# Patient Record
Sex: Female | Born: 1965 | Race: White | Hispanic: No | Marital: Married | Smoking: Never smoker
Health system: Southern US, Community
[De-identification: ages and names within clinical notes are randomized; demographics above are authoritative.]

---

## 2008-09-23 ENCOUNTER — Ambulatory Visit: Payer: Self-pay | Admitting: Family Medicine

## 2008-09-27 ENCOUNTER — Telehealth: Payer: Self-pay | Admitting: Occupational Medicine

## 2008-09-30 ENCOUNTER — Encounter: Admission: RE | Admit: 2008-09-30 | Discharge: 2008-09-30 | Payer: Self-pay | Admitting: Sports Medicine

## 2008-10-02 ENCOUNTER — Encounter: Admission: RE | Admit: 2008-10-02 | Discharge: 2008-10-02 | Payer: Self-pay | Admitting: Sports Medicine

## 2011-10-20 ENCOUNTER — Emergency Department (INDEPENDENT_AMBULATORY_CARE_PROVIDER_SITE_OTHER)
Admission: EM | Admit: 2011-10-20 | Discharge: 2011-10-20 | Disposition: A | Discharge status: Home / Self Care | Payer: No Typology Code available for payment source | Source: Home / Self Care | Attending: Family Medicine | Admitting: Family Medicine

## 2011-10-20 DIAGNOSIS — J069 Acute upper respiratory infection, unspecified: Secondary | ICD-10-CM

## 2011-10-20 DIAGNOSIS — J029 Acute pharyngitis, unspecified: Secondary | ICD-10-CM

## 2011-10-20 LAB — POCT INFLUENZA A/B
Influenza A, POC: NEGATIVE
Influenza B, POC: NEGATIVE

## 2011-10-20 MED ORDER — AMOXICILLIN 875 MG PO TABS: 875.0000 mg | ORAL_TABLET | Freq: Two times a day (BID) | ORAL | Status: AC

## 2011-10-20 MED ORDER — BENZONATATE 200 MG PO CAPS: 200.0000 mg | ORAL_CAPSULE | Freq: Every day | ORAL | Status: AC

## 2011-10-20 NOTE — ED Notes (Signed)
States symptoms started 2 days ago with nasal congestion and sore throat. States she has a hx of strep

## 2011-10-20 NOTE — ED Provider Notes (Signed)
History     CSN: [id number]  Arrival date & time 10/20/11  0911   First MD Initiated Contact with Patient 10/20/11 [phone]      Chief Complaint  Patient presents with  . Sore Throat      HPI Comments: Patient complains of approximately 2 day history of gradually progressive URI symptoms beginning with a mild sore throat and headache, followed by progressive nasal congestion.  A cough started today.  No fatigue or myalgias.  Cough is now generally non-productive.  There has been no pleuritic pain, shortness of breath, or wheezes.   Patient is a 45 y.o. female presenting with pharyngitis. The history is provided by the patient.  Sore Throat This is a new problem. The current episode started 2 days ago. The problem occurs constantly. The problem has been gradually worsening. Associated symptoms include headaches. Pertinent negatives include no chest pain, no abdominal pain and no shortness of breath. The symptoms are aggravated by swallowing. The symptoms are relieved by nothing. She has tried acetaminophen for the symptoms. The treatment provided no relief.    History reviewed. No pertinent past medical history.  History reviewed. No pertinent past surgical history.  Family History  Problem Relation Age of Onset  . Diabetes Father     History  Substance Use Topics  . Smoking status: Never Smoker   . Smokeless tobacco: Not on file  . Alcohol Use: Yes    OB History    Grav Para Term Preterm Abortions TAB SAB Ect Mult Living                  Review of Systems  Respiratory: Negative for shortness of breath.   Cardiovascular: Negative for chest pain.  Gastrointestinal: Negative for abdominal pain.  Neurological: Positive for headaches.   + sore throat + cough No pleuritic pain No wheezing + nasal congestion ? post-nasal drainage No sinus pain/pressure No itchy/red eyes ? earache No hemoptysis No SOB + fever/chills No nausea No vomiting No abdominal pain No  diarrhea No urinary symptoms No skin rashes No fatigue No myalgias + headache Used OTC meds without relief  Allergies  Review of patient's allergies indicates no known allergies.  Home Medications   Current Outpatient Rx  Name Route Sig Dispense Refill  . AMOXICILLIN 875 MG PO TABS Oral Take 1 tablet (875 mg total) by mouth 2 (two) times daily. 20 tablet 0  . BENZONATATE 200 MG PO CAPS Oral Take 1 capsule (200 mg total) by mouth at bedtime. Take as needed for cough 12 capsule 0    BP 148/89  Pulse 86  Temp(Src) 98.6 F (37 C) (Oral)  Resp 20  Ht 5\' 6"  (1.676 m)  Wt 143 lb 12 oz (65.205 kg)  BMI 23.20 kg/m2  SpO2 100%  LMP 10/01/2011  Physical Exam Nursing notes and Vital Signs reviewed. Appearance:  Patient appears healthy, stated age, and in no acute distress Eyes:  Pupils are equal, round, and reactive to light and accomodation.  Extraocular movement is intact.  Conjunctivae are not inflamed  Ears:  Canals normal.  Tympanic membranes normal.  Nose:  Mildly congested turbinates.  No sinus tenderness.    Pharynx:  Normal Neck:  Supple.  No adenopathy Lungs:  Clear to auscultation.  Breath sounds are equal.  Heart:  Regular rate and rhythm without murmurs, rubs, or gallops.  Abdomen:  Nontender without masses or hepatosplenomegaly.  Bowel sounds are present.  No CVA or flank tenderness.  Extremities:  No edema.  No calf tenderness Skin:  No rash present.   ED Course  Procedures  none   Labs Reviewed  POCT RAPID STREP A (OFFICE) negative  POCT INFLUENZA A/B negative      1. Acute upper respiratory infections of unspecified site   2. Acute pharyngitis       MDM  There is no evidence of bacterial infection today.   Treat symptomatically for now:  Take Mucinex D (guaifenesin with decongestant) twice daily for congestion.  Increase fluid intake, rest. May use Afrin nasal spray (or generic oxymetazoline) twice daily for about 5 days.  Also recommend using  saline nasal spray several times daily and/or saline nasal irrigation. Stop all antihistamines for now, and other non-prescription cough/cold preparations. May take Ibuprofen 200mg , 4 tabs every 8 hours with food for headache, fever, etc. Begin Amoxicillin if not improving about 5 days or if persistent fever develops.(given Rx to hold) Follow-up with family doctor if not improving 7 to 10 days.        Donna Christen, MD 10/20/11 1037

## 2015-05-04 ENCOUNTER — Encounter: Payer: No Typology Code available for payment source | Admitting: Family Medicine

## 2015-08-31 ENCOUNTER — Encounter: Payer: Self-pay | Admitting: Family Medicine

## 2015-08-31 ENCOUNTER — Ambulatory Visit (INDEPENDENT_AMBULATORY_CARE_PROVIDER_SITE_OTHER): Payer: No Typology Code available for payment source

## 2015-08-31 ENCOUNTER — Ambulatory Visit (INDEPENDENT_AMBULATORY_CARE_PROVIDER_SITE_OTHER): Payer: No Typology Code available for payment source | Admitting: Family Medicine

## 2015-08-31 VITALS — BP 133/67 | HR 71 | Temp 98.0°F | Ht 66.0 in | Wt 150.0 lb

## 2015-08-31 DIAGNOSIS — M2142 Flat foot [pes planus] (acquired), left foot: Secondary | ICD-10-CM

## 2015-08-31 DIAGNOSIS — M25562 Pain in left knee: Secondary | ICD-10-CM | POA: Diagnosis not present

## 2015-08-31 DIAGNOSIS — M2141 Flat foot [pes planus] (acquired), right foot: Secondary | ICD-10-CM | POA: Diagnosis not present

## 2015-08-31 DIAGNOSIS — M25561 Pain in right knee: Secondary | ICD-10-CM | POA: Diagnosis not present

## 2015-08-31 DIAGNOSIS — M21619 Bunion of unspecified foot: Secondary | ICD-10-CM | POA: Diagnosis not present

## 2015-08-31 DIAGNOSIS — M2242 Chondromalacia patellae, left knee: Secondary | ICD-10-CM | POA: Insufficient documentation

## 2015-08-31 DIAGNOSIS — M7061 Trochanteric bursitis, right hip: Secondary | ICD-10-CM | POA: Diagnosis not present

## 2015-08-31 NOTE — Assessment & Plan Note (Signed)
Recommending semirigid orthotics at some point.

## 2015-08-31 NOTE — Patient Instructions (Signed)
Thank you for coming in today. Attend physical therapy Return in about 3 weeks for recheck  Trochanteric Bursitis You have hip pain due to trochanteric bursitis. Bursitis means that the sack near the outside of the hip is filled with fluid and inflamed. This sack is made up of protective soft tissue. The pain from trochanteric bursitis can be severe and keep you from sleep. It can radiate to the buttocks or down the outside of the thigh to the knee. The pain is almost always worse when rising from the seated or lying position and with walking. Pain can improve after you take a few steps. It happens more often in people with hip joint and lumbar spine problems, such as arthritis or previous surgery. Very rarely the trochanteric bursa can become infected, and antibiotics and/or surgery may be needed. Treatment often includes an injection of local anesthetic mixed with cortisone medicine. This medicine is injected into the area where it is most tender over the hip. Repeat injections may be necessary if the response to treatment is slow. You can apply ice packs over the tender area for 30 minutes every 2 hours for the next few days. Anti-inflammatory and/or narcotic pain medicine may also be helpful. Limit your activity for the next few days if the pain continues. See your caregiver in 5-10 days if you are not greatly improved.  SEEK IMMEDIATE MEDICAL CARE IF:  You develop severe pain, fever, or increased redness.  You have pain that radiates below the knee. EXERCISES STRETCHING EXERCISES - Trochanteric Bursitis  These exercises may help you when beginning to rehabilitate your injury. Your symptoms may resolve with or without further involvement from your physician, physical therapist, or athletic trainer. While completing these exercises, remember:   Restoring tissue flexibility helps normal motion to return to the joints. This allows healthier, less painful movement and activity.  An effective  stretch should be held for at least 30 seconds.  A stretch should never be painful. You should only feel a gentle lengthening or release in the stretched tissue. STRETCH - Iliotibial Band  On the floor or bed, lie on your side so your injured leg is on top. Bend your knee and grab your ankle.  Slowly bring your knee back so that your thigh is in line with your trunk. Keep your heel at your buttocks and gently arch your back so your head, shoulders and hips line up.  Slowly lower your leg so that your knee approaches the floor/bed until you feel a gentle stretch on the outside of your thigh. If you do not feel a stretch and your knee will not fall farther, place the heel of your opposite foot on top of your knee and pull your thigh down farther.  Hold this stretch for __________ seconds.  Repeat __________ times. Complete this exercise __________ times per day. STRETCH - Hamstrings, Supine   Lie on your back. Loop a belt or towel over the ball of your foot as shown.  Straighten your knee and slowly pull on the belt to raise your injured leg. Do not allow the knee to bend. Keep your opposite leg flat on the floor.  Raise the leg until you feel a gentle stretch behind your knee or thigh. Hold this position for __________ seconds.  Repeat __________ times. Complete this stretch __________ times per day. STRETCH - Quadriceps, Prone   Lie on your stomach on a firm surface, such as a bed or padded floor.  Bend your knee and  grasp your ankle. If you are unable to reach your ankle or pant leg, use a belt around your foot to lengthen your reach.  Gently pull your heel toward your buttocks. Your knee should not slide out to the side. You should feel a stretch in the front of your thigh and/or knee.  Hold this position for __________ seconds.  Repeat __________ times. Complete this stretch __________ times per day. STRETCHING - Hip Flexors, Lunge Half kneel with your knee on the floor and your  opposite knee bent and directly over your ankle.  Keep good posture with your head over your shoulders. Tighten your buttocks to point your tailbone downward; this will prevent your back from arching too much.  You should feel a gentle stretch in the front of your thigh and/or hip. If you do not feel any resistance, slightly slide your opposite foot forward and then slowly lunge forward so your knee once again lines up over your ankle. Be sure your tailbone remains pointed downward.  Hold this stretch for __________ seconds.  Repeat __________ times. Complete this stretch __________ times per day. STRETCH - Adductors, Lunge  While standing, spread your legs.  Lean away from your injured leg by bending your opposite knee. You may rest your hands on your thigh for balance.  You should feel a stretch in your inner thigh. Hold for __________ seconds.  Repeat __________ times. Complete this exercise __________ times per day.   This information is not intended to replace advice given to you by your health care provider. Make sure you discuss any questions you have with your health care provider.   Document Released: 11/15/2004 Document Revised: 02/22/2015 Document Reviewed: 01/20/2009 Elsevier Interactive Patient Education [employer]2016 Elsevier Inc.

## 2015-08-31 NOTE — Assessment & Plan Note (Signed)
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 X-ray pending. Work on hip abduction strength and VMO strengthening at physical therapy. Return in a few weeks.

## 2015-08-31 NOTE — Assessment & Plan Note (Signed)
Right hip pain almost certainly due to greater trochanteric bursitis. This is likely due to hip abduction weakness. Patient is not interested in injection today. Plan for physical therapy including eye on toe phonophoresis as well as hip abduction strengthening. Return in a few weeks

## 2015-08-31 NOTE — Progress Notes (Signed)
Daisy NailLori Richardson is a 49 y.o. female who [facility]  today for establish care and discuss hip and knee pain.  Patient was doing well until last winter when she slipped and fell on ice. She landed on her left knee. She had initial pain which seemed to resolve over the next few weeks to months. She was doing pretty well until about a month ago when she started trying to run. With the onset of running she notes worsening bilateral left worse than right anterior knee pain and moderate to severe right lateral hip pain. The knee pain is worse with rising from a seated position and prolonged sitting with a flexed knee position. Additionally the knee pain is worse with climbing stairs. She notes some grinding and clicking in her left knee as well. The right hip pain is worse when she lays on her right side and when she rises from a seated position. She has not had any therapy for these problems yet. She's tried some over-the-counter medicines which helped a little.  She does note that she has flat feet and bunions bilaterally and uses rigid orthotics. She finds orthotics to be uncomfortable and heavy.   No past medical history on file. No past surgical history on file. Social History  Substance Use Topics  . Smoking status: Never Smoker   . Smokeless tobacco: Not on file  . Alcohol Use: Yes   family history includes Diabetes in her father.  ROS as above: No fevers chills nausea vomiting diarrhea weight loss weight gain feeling too hot or feeling too cold. Medications: No current outpatient prescriptions on file.   No current facility-administered medications for this visit.   No Known Allergies   Exam:  BP 133/67 mmHg  Pulse 71  Temp(Src) 98 F (36.7 C)  Ht 5\' 6"  (1.676 m)  Wt 150 lb (68.04 kg)  BMI 24.22 kg/m2  SpO2 100% Gen: Well NAD nontoxic appearing HEENT: EOMI,  MMM Lungs: Normal work of breathing. CTABL Heart: RRR no MRG Abd: NABS,  Soft. Nondistended, Nontender Exts: Brisk capillary refill, warm and well perfused.  Psych: Alert and oriented normal speech and thought process. Hips bilaterally are normal appearing. The right hip is tender to palpation at the greater trochanter the left is nontender. Range of motion of the hip is normal bilaterally. Hip abduction strength is 4/5 bilaterally. Right knee: Normal-appearing without any effusion. Range of motion 0-120 with no retropatellar crepitations. Nontender knee joint. Negative McMurray's test negative valgus and varus test and negative anterior posterior drawer tests. Strength is intact. Left knee:Normal-appearing without any effusion. VMO atrophy present Range of motion 0-120 with 1+ retropatellar crepitations. Nontender knee joint. Negative McMurray's test negative valgus and varus test and negative anterior posterior drawer tests. Strength is intact. Ankles bilaterally: Show pronation. Normal motion. Feet Bilaterally: Pes planus and bunions bilaterally. Pulses capillary refill and sensation intact Normal gait Leg lengths equal  X-ray knee bilaterally pending   No results found for this or any previous visit (from the past 24 hour(s)). No results found.   Please see individual assessment and plan sections.

## 2015-09-01 NOTE — Progress Notes (Signed)
Quick Note:  Both knee xrays are normal ______

## 2015-09-05 ENCOUNTER — Encounter: Payer: No Typology Code available for payment source | Admitting: Physical Therapy

## 2015-09-06 ENCOUNTER — Ambulatory Visit (INDEPENDENT_AMBULATORY_CARE_PROVIDER_SITE_OTHER): Payer: No Typology Code available for payment source | Admitting: Physical Therapy

## 2015-09-06 ENCOUNTER — Encounter: Payer: Self-pay | Admitting: Physical Therapy

## 2015-09-06 DIAGNOSIS — R531 Weakness: Secondary | ICD-10-CM | POA: Diagnosis not present

## 2015-09-06 DIAGNOSIS — M25561 Pain in right knee: Secondary | ICD-10-CM

## 2015-09-06 DIAGNOSIS — M25562 Pain in left knee: Secondary | ICD-10-CM | POA: Diagnosis not present

## 2015-09-06 DIAGNOSIS — M25551 Pain in right hip: Secondary | ICD-10-CM | POA: Diagnosis not present

## 2015-09-06 NOTE — Patient Instructions (Signed)
Straight Leg Raise: With External Leg Rotation   [place] [phone]    Lie on back with right leg straight, opposite leg bent. Rotate straight leg out and lift _~15___ inches. Repeat __15__ times per set. Do __`__ sets per session. Do ___`_ sessions per day.  To challenge yourself, be in a long sit with legs out in front. Perform leg lift as above, then holding it up, move it out to the side, back in, then lower.    Strengthening: Hip Abduction (Side-Lying)    Tighten muscles on front of left thigh, then lift leg __~15__ inches from surface, keeping knee locked.  Repeat __15__ times per set. Do ___3_ sets per session. Do __1__ sessions per day.  Standing: Advanced    Stand, feet together. Bend torso and hips forward. Place hands under front of feet. Exhale and pull torso toward legs. Hold _45__ seconds. Repeat _1__ times per session. Do _1__ sessions per day.  Iliotibial Band Stretch, Standing    Stand, one leg crossed behind other leg. Bending at waist, reach toward back foot. Hold _45__ seconds.  Repeat __1_ times per session. Do _1__ sessions per day. Repeat on the other side.   Copyright  VHI. All rights reserved.

## 2015-09-06 NOTE — Therapy (Signed)
[facility] [place] [address] [place], [place], [address] Phone: [phone]   Fax:  [phone]  Physical Therapy Evaluation  Patient Details  Name: Daisy Richardson MRN: [id number] Date of Birth: [birth year] Referring Provider: Dr Clementeen Graham  Encounter Date: 09/06/2015      PT End of Session - 09/06/15 1452    Visit Number 1   Number of Visits 4   Date for PT Re-Evaluation 10/04/15   PT Start Time 1146   PT Stop Time 1227   PT Time Calculation (min) 41 min   Activity Tolerance Patient limited by fatigue      History reviewed. No pertinent past medical history.  History reviewed. No pertinent past surgical history.  There were no vitals filed for this visit.  Visit Diagnosis:  Weakness generalized - Plan: PT plan of care cert/re-cert  Pain, hip, right - Plan: PT plan of care cert/re-cert  Arthralgia of both knees - Plan: PT plan of care cert/re-cert      Subjective Assessment - 09/06/15 1151    Subjective Patient was doing fine until she attempted to start interval running about 4 months ago to add to her walking.  About 3 weeks ago she deleloped Lt knee pain, she kept on going and now has Rt hip pain too. Still able to walk 3 miles a day however the pain comes goes during activity.    Pertinent History Jan of this year she fell on the ice, landing on her Lt knee, took car of it at home and it resolved.    Diagnostic tests x-rays were negative for knee, did not xrays hips.    Patient Stated Goals stop limping, get rid of Rt hip pain get on/off the floor without difficulty   Currently in Pain? No/denies   Aggravating Factors  not sure, has intermittent pain at random times in both the Rt hip, Lt knee.             [facility] PT Assessment - 09/06/15 0001    Assessment   Medical Diagnosis Rt hip greater troch busitis, Lt patellar femoral pain   Referring Provider Dr Clementeen Graham   Onset Date/Surgical Date 08/09/15   Hand  Dominance Right   Next MD Visit 09/22/15   Prior Therapy none   Precautions   Precautions None   Balance Screen   Has the patient fallen in the past 6 months No   Has the patient had a decrease in activity level because of a fear of falling?  No   Is the patient reluctant to leave their home because of a fear of falling?  No   Home [employer] information centre manager residence   Living Arrangements Spouse/significant other   Home Layout Two level  pain with stairs   Prior Function   Level of Independence Independent   Vocation --  stays at home   Leisure walk, play with kids   Observation/Other Assessments   Focus on Therapeutic Outcomes (FOTO)  55% limited   Functional Tests   Functional tests Squat;Single leg stance   Squat   Comments WNL   Single Leg Stance   Comments Rt 15 sec, Lt 8 sec   Posture/Postural Control   Posture/Postural Control Postural limitations   Postural Limitations --  pes planus bilat.    ROM / Strength   AROM / PROM / Strength Strength  LE's ROM WNL   Strength   Strength Assessment Site Hip;Knee;Ankle   Right/Left  Hip Right;Left   Right Hip Flexion 5/5   Right Hip Extension --  5-/5   Right Hip ABduction 4+/5   Left Hip Flexion 4+/5   Left Hip Extension --  5-/5   Left Hip ABduction 4+/5   Right/Left Knee --  WNL except Lt knee ext 4/5   Right/Left Ankle --  WNL   Palpation   Palpation comment no tenderness in Lt knee,                    [facility] Adult PT Treatment/Exercise - 09/06/15 0001    Exercises   Exercises Knee/Hip   Knee/Hip Exercises: Stretches   Active Hamstring Stretch --  45 sec bilat in standing   ITB Stretch --  45 sec bilat in standing   Knee/Hip Exercises: Supine   Straight Leg Raise with External Rotation Strengthening;Both  3x15   Knee/Hip Exercises: Sidelying   Hip ABduction Strengthening;Both  3x15                PT Education - 09/06/15 1227    Education provided Yes   Education  Details HEP    Person(s) Educated Patient   Methods Explanation;Demonstration;Handout   Comprehension Returned demonstration;Verbalized understanding             PT Long Term Goals - 09/06/15 1455    PT LONG TERM GOAL #1   Title I with advanced HEP to include interval running (10/04/15)    Time 4   Period Weeks   Status New   PT LONG TERM GOAL #2   Title demo bilat hip strength =/> 5-/5 (10/04/15)    Time 4   Period Weeks   Status New   PT LONG TERM GOAL #3   Title demo Lt quad strength =/> 5-/5 (10/04/15)    Time 4   Period Weeks   Status New   PT LONG TERM GOAL #4   Title improve FOTO =/< 34% limited (10/04/15)    Time 4   Period Weeks   Status New               Plan - 09/06/15 1452    Clinical Impression Statement 49 yo female presents with Lt knee pain and Rt hip pain.  It began with the Lt knee.  The pain is intermittent and doesn't seem to follow a pattern.  She has weakness in bilat hips and lateral tracking of the Lt patella. She wishes to be able to get up/down from the floor without pain/difficulty and return to interval running. She has old orthotics and will be gettin new ones.    Pt will benefit from skilled therapeutic intervention in order to improve on the following deficits Postural dysfunction;Decreased strength;Pain;Decreased activity tolerance   Rehab Potential Excellent   PT Frequency 1x / week   PT Duration 4 weeks   PT Treatment/Interventions Ultrasound;Neuromuscular re-education;Patient/family education;Cryotherapy;Electrical Stimulation;Iontophoresis /ml Dexamethasone;Moist Heat;Therapeutic exercise;Manual techniques;Vasopneumatic Device;Taping;Dry needling   PT Next Visit Plan cont LE flexibility and strengthening consider ionto to Rt hip if not any better.    Consulted and Agree with Plan of Care Patient         Problem List Patient Active Problem List   Diagnosis Date Noted  . Greater trochanteric bursitis of right hip  08/31/2015  . 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 Pes planus of both feet 08/31/2015  . Bunion of great toe 08/31/2015    Roderic Scarce PT 09/06/2015, 2:59 PM  [facility] [address] [place], [place], [address] Phone: [phone]   Fax:  [phone]  Name: Rutherford NailLori Lubin MRN: [id number] Date of Birth: 1965-12-27

## 2015-09-13 ENCOUNTER — Ambulatory Visit (INDEPENDENT_AMBULATORY_CARE_PROVIDER_SITE_OTHER): Payer: No Typology Code available for payment source | Admitting: Physical Therapy

## 2015-09-13 DIAGNOSIS — M25561 Pain in right knee: Secondary | ICD-10-CM

## 2015-09-13 DIAGNOSIS — M25562 Pain in left knee: Secondary | ICD-10-CM | POA: Diagnosis not present

## 2015-09-13 DIAGNOSIS — M25551 Pain in right hip: Secondary | ICD-10-CM | POA: Diagnosis not present

## 2015-09-13 DIAGNOSIS — R531 Weakness: Secondary | ICD-10-CM

## 2015-09-13 NOTE — Patient Instructions (Signed)
Balance: Unilateral [employer]- Forward Lean  [place] [phone]    Stand on left foot, hands on hips. Keeping hips level, bend forward as if to touch forehead to wall. Hold __1__ seconds. Relax. Repeat _10___ times per set. Do __2__ sets per session. Do _1___ sessions per day.  Quad Strength, Proprioception: Step Over    Step forward with left leg off step,  touching heel to ground with no weight on heel. Return to start. Use __8__ inch step. Repeat _2x10__ times. Do __1__ sessions per day. Repeat on the other side.   Copyright  VHI. All rights reserved.

## 2015-09-13 NOTE — Therapy (Signed)
[place], [place], [address] Phone: [phone]   Fax:  [phone]  Physical Therapy Treatment  Patient Details  Name: Daisy Richardson MRN: [id number] Date of Birth: May 05, 1966 Referring Provider: Dr Lynne Leader  Encounter Date: 09/13/2015      PT End of Session - 09/13/15 1105    Visit Number 2   Number of Visits 4   Date for PT Re-Evaluation 10/04/15   PT Start Time 1101   PT Stop Time 1150   PT Time Calculation (min) 49 min   Activity Tolerance Patient tolerated treatment well      No past medical history on file.  No past surgical history on file.  There were no vitals filed for this visit.  Visit Diagnosis:  Weakness generalized  Pain, hip, right  Arthralgia of both knees      Subjective Assessment - 09/13/15 1103    Subjective Pt states she feels like her knee is some better, the hips feel like they are stronger however the pain is traveling down the side of the leg now. Feels like she still limps.    Currently in Pain? Yes   Pain Score 6    Pain Location Leg   Pain Orientation Right;Lateral   Pain Descriptors / Indicators Aching   Pain Type Acute pain   Aggravating Factors  getting on the floor, s/l hip abduction   Pain Relieving Factors Sharlyne Pacas Adult PT Treatment/Exercise - 09/13/15 0001    Knee/Hip Exercises: Stretches   ITB Stretch 3 reps  45 sec cross body with strap   Knee/Hip Exercises: Aerobic   Nustep L6x5'   Knee/Hip Exercises: Standing   Step Down Both;2 sets;10 reps;Step Height: 8"   SLS with FWD leans   Knee/Hip Exercises: Supine   Bridges Limitations 10 reps, then with knee extension  VC for form   Knee/Hip Exercises: Sidelying   Clams 3x10 bilat   Modalities   Modalities Iontophoresis   Iontophoresis   Type of Iontophoresis Dexamethasone   Location Rt hip, greater trochanter   Dose 1.0cc   Time 6 hr  patch   Manual Therapy   Manual Therapy Myofascial release   Myofascial Release Rt ITB with rollor and manual                PT Education - 09/13/15 1112    Education provided Yes   Education Details HEP progression   Person(s) Educated Patient   Methods Explanation;Demonstration;Handout   Comprehension Returned demonstration             PT Long Term Goals - 09/13/15 1127    PT LONG TERM GOAL #1   Title I with advanced HEP to include interval running (10/04/15)    Status On-going   PT LONG TERM GOAL #2   Title demo bilat hip strength =/> 5-/5 (10/04/15)    Status On-going   PT LONG TERM GOAL #3   Title demo Lt quad strength =/> 5-/5 (10/04/15)    Status On-going   PT LONG TERM GOAL #4   Title improve FOTO =/< 34% limited (10/04/15)    Status On-going               Plan - 09/13/15 1138    Clinical Impression Statement This is patients second visit.  No goal met  however she is getting stronger.   Pain is changing and radiating down into her ITB now.     Pt will benefit from skilled therapeutic intervention in order to improve on the following deficits Postural dysfunction;Decreased strength;Pain;Decreased activity tolerance   Rehab Potential Excellent   PT Frequency 1x / week   PT Duration 4 weeks   PT Treatment/Interventions Ultrasound;Neuromuscular re-education;Patient/family education;Cryotherapy;Electrical Stimulation;Iontophoresis 34m/ml Dexamethasone;Moist Heat;Therapeutic exercise;Manual techniques;Vasopneumatic Device;Taping;Dry needling   PT Next Visit Plan cont LE flexibility and strengthening, see reponse to ionto   Consulted and Agree with Plan of Care Patient        Problem List Patient Active Problem List   Diagnosis Date Noted  . Greater trochanteric bursitis of right hip 08/31/2015  . 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 Pes planus of both feet 08/31/2015  . Bunion of great toe 08/31/2015    SJeral Pinch PT 09/13/2015, 11:56 AM  [facility] [place] [address]Phone: [phone]  Fax:  [phone] Name: Daisy ErpeldingMRN: [id number] of Birth: [birth year]

## 2015-09-20 ENCOUNTER — Ambulatory Visit (INDEPENDENT_AMBULATORY_CARE_PROVIDER_SITE_OTHER): Payer: No Typology Code available for payment source | Admitting: Physical Therapy

## 2015-09-20 DIAGNOSIS — M25551 Pain in right hip: Secondary | ICD-10-CM

## 2015-09-20 DIAGNOSIS — M25562 Pain in left knee: Secondary | ICD-10-CM | POA: Diagnosis not present

## 2015-09-20 DIAGNOSIS — R531 Weakness: Secondary | ICD-10-CM

## 2015-09-20 DIAGNOSIS — M25561 Pain in right knee: Secondary | ICD-10-CM

## 2015-09-20 NOTE — Therapy (Signed)
[place], [place], [address] Phone: [phone]   Fax:  [phone]  Physical Therapy Treatment  Patient Details  Name: Daisy Richardson MRN: [id number] Date of Birth: [birth year] Referring Provider: Dr Lynne Leader  Encounter Date: 09/20/2015      PT End of Session - 09/20/15 1109    Visit Number 3   Number of Visits 4   Date for PT Re-Evaluation 10/04/15   PT Start Time 1108   PT Stop Time 1142   PT Time Calculation (min) 34 min   Activity Tolerance Patient tolerated treatment well      No past medical history on file.  No past surgical history on file.  There were no vitals filed for this visit.  Visit Diagnosis:  Weakness generalized  Pain, hip, right  Arthralgia of both knees      Subjective Assessment - 09/20/15 1109    Subjective Pt reports the outside of her leg feels better however the pain is now on the front of her Rt hip bones.  Bought a foam rollor to use at home.    Patient Stated Goals stop limping, get rid of Rt hip pain get on/off the floor without difficulty   Currently in Pain? Yes   Pain Score 7    Pain Location Pelvis   Pain Orientation Right;Anterior   Pain Descriptors / Indicators Aching   Pain Type Acute pain   Pain Frequency Intermittent   Aggravating Factors  getting up/down from the floor   Pain Relieving Factors Sharlyne Pacas PT Assessment - 09/20/15 0001    Assessment   Medical Diagnosis Rt hip greater troch busitis, Lt patellar femoral pain   Referring Provider Dr Lynne Leader   Onset Date/Surgical Date 08/09/15   Hand Dominance Right   Next MD Visit 09/22/15   Prior Therapy none   Strength   Strength Assessment Site Knee;Hip   Right Hip Extension --  5-/5   Right Hip ABduction 5/5   Left Hip Flexion 5/5   Left Hip Extension --  5-/5   Left Hip ABduction --  5-/5   Right/Left Knee --  Lt knee ext 4+/5, others 5/5                      [facility] Adult PT Treatment/Exercise - 09/20/15 0001    Knee/Hip Exercises: Aerobic   Nustep L6x5'   Knee/Hip Exercises: Standing   SLS with FWD leans   Other Standing Knee Exercises hop and sticks length of gym   Knee/Hip Exercises: Supine   Bridges Limitations 15 reps then 10 with knee extension    Knee/Hip Exercises: Sidelying   Other Sidelying Knee/Hip Exercises on foam roller bilat ITB & TFL   Modalities   Modalities Iontophoresis   Iontophoresis   Type of Iontophoresis Dexamethasone   Location Rt hip, greater trochanter   Dose 1.0cc   Time 6 hr patch                PT Education - 09/20/15 1123    Education provided Yes   Education Details HEP    Person(s) Educated Patient   Methods Demonstration;Handout;Explanation   Comprehension Returned demonstration             PT Long Term Goals - 09/20/15 1112    PT LONG TERM GOAL #1   Title I with advanced HEP to include  interval running (10/04/15)    Time 4   Period Weeks   Status On-going   PT LONG TERM GOAL #2   Title demo bilat hip strength =/> 5-/5 (10/04/15)    Time 4   Period Weeks   Status Partially Met   PT LONG TERM GOAL #3   Title demo Lt quad strength =/> 5-/5 (10/04/15)    Period Weeks   Status On-going   PT LONG TERM GOAL #4   Title improve FOTO =/< 34% limited (10/04/15)    Time 4   Period Weeks   Status On-going               Plan - 09/20/15 1136    Clinical Impression Statement Pt's pain is moving around in the Rt hip, she is getting stronger, partially met her strength  goals. She has tightness now centering more into the TFL, coming up and out of the ITB.    Pt will benefit from skilled therapeutic intervention in order to improve on the following deficits Postural dysfunction;Decreased strength;Pain;Decreased activity tolerance   Rehab Potential Excellent   PT Frequency 1x / week   PT Duration 4 weeks   PT Treatment/Interventions  Ultrasound;Neuromuscular re-education;Patient/family education;Cryotherapy;Electrical Stimulation;Iontophoresis 24m/ml Dexamethasone;Moist Heat;Therapeutic exercise;Manual techniques;Vasopneumatic Device;Taping;Dry needling   PT Next Visit Plan reassess   Consulted and Agree with Plan of Care Patient        Problem List Patient Active Problem List   Diagnosis Date Noted  . Greater trochanteric bursitis of right hip 08/31/2015  . 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 Pes planus of both feet 08/31/2015  . Bunion of great toe 08/31/2015    SJeral PinchPT 09/20/2015, 11:48 AM  [facility] [address]Phone: [phone]  Fax:  [phone] Name: LPrezley QadirMRN: [id number] of Birth: [birth year]

## 2015-09-20 NOTE — Patient Instructions (Signed)
Bridging    Slowly raise buttocks from floor, keeping stomach tight. Repeat __15__ times per set. Do __1__ sets per session. Do __1__ sessions per day.   Bridging: with Straight Leg Raise    With legs bent, lift buttocks ____ inches from floor. Then slowly extend right knee, keeping stomach tight. Repeat __10__ times per set. Do __2-3__ sets per session. Do __1__ sessions per day.  Balance / Reach - start by reaching to a couch or coffee table, progress to touching the floor.    Stand on left foot, Holding __0__ pound weight in other hand. Bend knee, lowering body, and reach across. Hold __1__ seconds. Relax. Return to upright posture after every lean.  Repeat __10__ times per set. Do _2-3__ sets per session. Do __1__ sessions per day. Repeat on the other leg.  Copyright  VHI. All rights reserved.

## 2015-09-27 ENCOUNTER — Encounter: Payer: Self-pay | Admitting: Physical Therapy

## 2015-09-27 ENCOUNTER — Ambulatory Visit (INDEPENDENT_AMBULATORY_CARE_PROVIDER_SITE_OTHER): Payer: No Typology Code available for payment source | Admitting: Physical Therapy

## 2015-09-27 DIAGNOSIS — M25551 Pain in right hip: Secondary | ICD-10-CM | POA: Diagnosis not present

## 2015-09-27 DIAGNOSIS — R531 Weakness: Secondary | ICD-10-CM | POA: Diagnosis not present

## 2015-09-27 NOTE — Patient Instructions (Signed)

## 2015-09-27 NOTE — Therapy (Signed)
[facility] [place] [address] [place], [place], [address] Phone: [phone]   Fax:  [phone]  Physical Therapy Treatment  Patient Details  Name: Daisy Richardson MRN: [id number] Date of Birth: [birth year] Referring Provider: Dr Daisy Richardson  Encounter Date: 09/27/2015      PT End of Session - 09/27/15 1155    Visit Number 4   Number of Visits 8   Date for PT Re-Evaluation 10/25/15   PT Start Time 1155   PT Stop Time 1250   PT Time Calculation (min) 55 min   Activity Tolerance Patient tolerated treatment well      History reviewed. No pertinent past medical history.  History reviewed. No pertinent past surgical history.  There were no vitals filed for this visit.  Visit Diagnosis:  Weakness generalized - Plan: PT plan of care cert/re-cert  Pain, hip, right - Plan: PT plan of care cert/re-cert      Subjective Assessment - 09/27/15 1158    Subjective Pt states she was feeling good, didn't take the alieve yesterday and had a lot of anterior Rt hip pain 7/10, today with alieve 3/10.     Patient Stated Goals stop limping, get rid of Rt hip pain get on/off the floor without difficulty   Currently in Pain? Yes   Pain Score 3    Pain Location Hip   Pain Orientation Right;Anterior   Pain Descriptors / Indicators Stabbing   Pain Type Acute pain   Pain Frequency Intermittent   Aggravating Factors  getting up/down from the floor, walking    Pain Relieving Factors Massie Kluver PT Assessment - 09/27/15 0001    Assessment   Medical Diagnosis Rt hip greater troch busitis, Lt patellar femoral pain   Referring Provider Dr Daisy Richardson   Onset Date/Surgical Date 08/09/15   Hand Dominance Right   Next MD Visit after PT   Prior Therapy none   Observation/Other Assessments   Focus on Therapeutic Outcomes (FOTO)  33% limited   ROM / Strength   AROM / PROM / Strength Strength   Strength   Overall Strength Comments  patient demo's functional  weakness in her hips when in weightbearing.    Strength Assessment Site Knee;Hip;Lumbar   Right Hip Flexion 5/5   Right Hip Extension --  5-/5   Right Hip ABduction 5/5   Left Hip Flexion 5/5   Left Hip Extension --  5-/5   Left Hip ABduction --  5-/5   Lumbar Flexion --  fair   Lumbar Extension --  good (-)    Flexibility   Soft Tissue Assessment /Muscle Length --  tightness in Rt ITB and TFL   Palpation   Palpation comment tenderness in lateral Rt hip                      [facility] Adult PT Treatment/Exercise - 09/27/15 0001    Knee/Hip Exercises: Supine   Bridges Limitations --  15 reps articulating bridges   Knee/Hip Exercises: Prone   Straight Leg Raises Strengthening;Both;10 reps  in quadraped then hip abduction   Straight Leg Raises Limitations --   Modalities   Modalities Iontophoresis;Moist Heat   Moist Heat Therapy   Number Minutes Moist Heat 15 Minutes   Moist Heat Location Hip  Rt   Iontophoresis   Type of Iontophoresis Dexamethasone   Location Rt hip, greater trochanter   Dose  1.0cc   Time 6 hr patch   Manual Therapy   Manual Therapy Soft tissue mobilization   Manual therapy comments TPR to Rt quad with passive stretch   Soft tissue mobilization Rt gluts/TFL in s/l           Trigger Point Dry Needling - 09/27/15 1220    Consent Given? Yes   Education Handout Provided Yes   Muscles Treated Upper Body Tensor fascia lata;Gluteus maximus;Gluteus minimus              PT Education - 09/27/15 1219    Education provided Yes   Education Details TDN   Person(s) Educated Patient   Methods Explanation;Handout   Comprehension Verbalized understanding             PT Long Term Goals - 09/27/15 1251    PT LONG TERM GOAL #1   Title I with advanced HEP to include interval running (10/28/15)    Time 4   Period Weeks   Status On-going   PT LONG TERM GOAL #2   Title demo bilat hip strength =/> 5-/5  (10/04/15)    Status Achieved   PT LONG TERM GOAL #3   Title demo Lt quad strength =/> 5-/5 (10/28/15)    Time 4   Period Weeks   Status On-going   PT LONG TERM GOAL #4   Title improve FOTO =/< 34% limited (10/04/15)    Status Achieved  scored 33% limited               Plan - 09/27/15 1239    Clinical Impression Statement Daisy Richardson was reassessed today. Her knee issues are completely resolved however she is still having issued in the Rt hip.  She can manage her pain only if she uses medication.  She continues with some core and hip weakness and would benefit from continued PT. with focus on the hips and core.    Pt will benefit from skilled therapeutic intervention in order to improve on the following deficits Postural dysfunction;Decreased strength;Pain;Decreased activity tolerance   Rehab Potential Excellent   PT Frequency 1x / week   PT Duration 4 weeks   PT Treatment/Interventions Vasopneumatic Device;Manual techniques;Therapeutic exercise;Moist Heat;Iontophoresis 4mg /ml Dexamethasone;Electrical Stimulation;Dry needling;Cryotherapy;Patient/family education;Passive range of motion;Ultrasound   PT Next Visit Plan assess response to TDN   Consulted and Agree with Plan of Care Patient        Problem List Patient Active Problem List   Diagnosis Date Noted  . Greater trochanteric bursitis of right hip 08/31/2015  . 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 Pes planus of both feet 08/31/2015  . Bunion of great toe 08/31/2015    Roderic ScarceSusan Renika Shiflet PT 09/27/2015, 12:54 PM  [facility] [address] [place], [place], [address] Phone: [phone]   Fax:  [phone]  Name: Daisy NailLori Richardson MRN: [id number] Date of Birth: [birth year]

## 2015-10-04 ENCOUNTER — Encounter: Payer: Self-pay | Admitting: Physical Therapy

## 2015-10-04 ENCOUNTER — Ambulatory Visit (INDEPENDENT_AMBULATORY_CARE_PROVIDER_SITE_OTHER): Payer: No Typology Code available for payment source | Admitting: Physical Therapy

## 2015-10-04 DIAGNOSIS — M25562 Pain in left knee: Secondary | ICD-10-CM

## 2015-10-04 DIAGNOSIS — R531 Weakness: Secondary | ICD-10-CM

## 2015-10-04 DIAGNOSIS — M25551 Pain in right hip: Secondary | ICD-10-CM

## 2015-10-04 DIAGNOSIS — M25561 Pain in right knee: Secondary | ICD-10-CM | POA: Diagnosis not present

## 2015-10-04 NOTE — Therapy (Addendum)
[place] [address] [place], [place], [address] Phone: [phone]   Fax:  [phone]  Physical Therapy Treatment  Patient Details  Name: Daisy Richardson MRN: [id number] Date of Birth: [birth year] Referring Provider: Dr Lynne Leader  Encounter Date: 10/04/2015      PT End of Session - 10/04/15 1144    Visit Number 5   Number of Visits 8   Date for PT Re-Evaluation 10/25/15   PT Start Time 2683   PT Stop Time 1245   PT Time Calculation (min) 60 min   Activity Tolerance Patient tolerated treatment well      History reviewed. No pertinent past medical history.  History reviewed. No pertinent past surgical history.  There were no vitals filed for this visit.  Visit Diagnosis:  Weakness generalized  Pain, hip, right  Arthralgia of both knees      Subjective Assessment - 10/04/15 1147    Subjective Pt reports she is better than she was last week, yesterday was a really good day however today after walking 3 miles she is sore, took an advil.    Currently in Pain? Yes   Pain Score 7   during her walk, pt to pay attention when walking and see if she can tell which part of the walking she feels it.    Pain Location Hip   Pain Orientation Right;Lateral   Pain Type Acute pain   Pain Frequency Intermittent   Aggravating Factors  walking   Pain Relieving Factors Sharlyne Pacas Adult PT Treatment/Exercise - 10/04/15 0001    Knee/Hip Exercises: Stretches   Sports administrator Both;2 reps;30 seconds  prone with strap, resting on elbows   ITB Stretch Both;2 reps  45 sec holds in standing   Knee/Hip Exercises: Aerobic   Nustep L6x6'   Knee/Hip Exercises: Standing   Functional Squat 20 reps  single leg Rt lowering to mat   Other Standing Knee Exercises 2x10 squat with lift up with top foot on BOSU   Knee/Hip Exercises: Prone   Straight Leg Raises Both;Strengthening;20 reps  opposite  arm/leg lfts   Moist Heat Therapy   Number Minutes Moist Heat 15 Minutes   Moist Heat Location Hip  Rt/ buttocks   Iontophoresis   Type of Iontophoresis Dexamethasone   Location Rt hip, greater trochanter   Dose 1.0cc   Time 6 hr patch   Manual Therapy   Manual Therapy Soft tissue mobilization;Joint mobilization   Joint Mobilization lumbar CPA and bilat UPA mobs grade III   Soft tissue mobilization Rt gluts, QL          Trigger Point Dry Needling - 10/04/15 1223    Consent Given? Yes   Education Handout Provided No   Muscles Treated Upper Body Gluteus minimus;Gluteus maximus                   PT Long Term Goals - 10/04/15 1150    PT LONG TERM GOAL #1   Title I with advanced HEP to include interval running (10/28/15)    Status On-going   PT LONG TERM GOAL #2   Title demo bilat hip strength =/> 5-/5 (10/04/15)    Status Achieved   PT LONG TERM GOAL #3   Title demo Lt quad strength =/> 5-/5 (10/28/15)    Status On-going   PT  LONG TERM GOAL #4   Title improve FOTO =/< 34% limited (10/04/15)    Status Achieved               Plan - 10/04/15 1236    Clinical Impression Statement Daisy Richardson is seeing improvement overall. still having to use some OTC medication at times.  Feels the TDN helped, had decrease twitch response today as compared to last visit. She also has decreased trigger points.  She continues to need functional strength training for her LE's and core.    Pt will benefit from skilled therapeutic intervention in order to improve on the following deficits Postural dysfunction;Decreased strength;Pain;Decreased activity tolerance   Rehab Potential Excellent   PT Frequency 1x / week   PT Duration 4 weeks   PT Treatment/Interventions Vasopneumatic Device;Manual techniques;Therapeutic exercise;Moist Heat;Iontophoresis 67m/ml Dexamethasone;Electrical Stimulation;Dry needling;Cryotherapy;Patient/family education;Passive range of motion;Ultrasound   PT Next Visit  Plan STW PRN Rt hip/gluts, functional strength training LE's and core.  See what part of the gait pattern brings on her pain   Consulted and Agree with Plan of Care Patient        Problem List Patient Active Problem List   Diagnosis Date Noted  . Greater trochanteric bursitis of right hip 08/31/2015  . 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 Pes planus of both feet 08/31/2015  . Bunion of great toe 08/31/2015    SJeral PinchPT 10/04/2015, 12:38 PM  [facility] [place] [address]Phone: [phone]  Fax:  [phone] Name: Daisy BastaracheMRN: [id number] of Birth: [birth year]   PHYSICAL THERAPY DISCHARGE SUMMARY  Visits from Start of Care: 5  Current functional level related to goals / functional outcomes: unknown   Remaining deficits: unknown   Education / Equipment: See above Plan: Patient agrees to discharge.  Patient goals were partially met. Patient is being discharged due to the patient's request.  ?????   SJeral Pinch PT 11/24/2015 11:41 AM

## 2015-10-11 ENCOUNTER — Encounter: Payer: Self-pay | Admitting: Physical Therapy

## 2015-10-13 ENCOUNTER — Encounter: Payer: Self-pay | Admitting: Physical Therapy

## 2015-11-24 NOTE — Addendum Note (Signed)
Addended by: Joachim Carton E on: 11/24/2015 11:42 AM   Modules accepted: Orders

## 2016-05-22 ENCOUNTER — Encounter: Payer: Self-pay | Admitting: Family Medicine

## 2016-05-22 ENCOUNTER — Ambulatory Visit (INDEPENDENT_AMBULATORY_CARE_PROVIDER_SITE_OTHER): Payer: No Typology Code available for payment source | Admitting: Family Medicine

## 2016-05-22 DIAGNOSIS — S76311A Strain of muscle, fascia and tendon of the posterior muscle group at thigh level, right thigh, initial encounter: Secondary | ICD-10-CM | POA: Diagnosis not present

## 2016-05-22 DIAGNOSIS — S76319A Strain of muscle, fascia and tendon of the posterior muscle group at thigh level, unspecified thigh, initial encounter: Secondary | ICD-10-CM | POA: Insufficient documentation

## 2016-05-22 NOTE — Patient Instructions (Signed)
Thank you for coming in today. Attend PT.  Do the askling hamstring exercises Return in 1 month or sooner if worsening.    Hamstring Strain With Rehab The hamstring muscle and tendons are vulnerable to muscle or tendon tear (strain). Hamstring tears cause pain and inflammation in the backside of the thigh, where the hamstring muscles are located. The hamstring is comprised of three muscles that are responsible for straightening the hip, bending the knee, and stabilizing the knee. These muscles are important for walking, running, and jumping. Hamstring strain is the most common injury of the thigh. Hamstring strains are classified as grade 1 or 2 strains. Grade 1 strains cause pain, but the tendon is not lengthened. Grade 2 strains include a lengthened ligament due to the ligament being stretched or partially ruptured. With grade 2 strains there is still function, although the function may be decreased.  SYMPTOMS   Pain, tenderness, swelling, warmth, or redness over the hamstring muscles, at the back of the thigh.  Pain that gets worse during and after intense activity.  A "pop" heard in the area, at the time of injury.  Muscle spasm in the hamstring muscles.  Pain or weakness with running, jumping, or bending the knee against resistance.  Crackling sound (crepitation) when the tendon is moved or touched.  Bruising (contusion) in the thigh within 48 hours of injury.  Loss of fullness of the muscle, or area of muscle bulging in the case of a complete rupture. CAUSES  A muscle strain occurs when a force is placed on the muscle or tendon that is greater than it can withstand. Common causes of injury include:  Strain from overuse or sudden increase in the frequency, duration, or intensity of activity.  Single violent blow or force to the back of the knee or the hamstring area of the thigh. RISK INCREASES WITH:  Sports that require quick starts (sprinting, racquetball, tennis).  Sports  that require jumping (basketball, volleyball).  Kicking sports and water skiing.  Contact sports (soccer, football).  Poor strength and flexibility.  Failure to warm up properly before activity.  Previous thigh, knee, or pelvis injury.  Poor exercise technique.  Poor posture. PREVENTION  Maintain physical fitness:  Strength, flexibility, and endurance.  Cardiovascular fitness.  Learn and use proper exercise technique and posture.  Wear proper fitted and padded protective equipment. PROGNOSIS  If treated properly, hamstring strains are usually curable in 2 to 6 weeks. RELATED COMPLICATIONS   Longer healing time, if not properly treated or if not given adequate time to heal.  Chronically inflamed tendon, causing persistent pain with activity that may progress to constant pain.  Recurring symptoms, if activity is resumed too soon.  Vulnerable to repeated injury (in up to 25% of cases). TREATMENT  Treatment first involves the use of ice and medication to help reduce pain and inflammation. It is also important to complete strengthening and stretching exercises, as well as modifying any activities that aggravate the symptoms. These exercises may be completed at home or with a therapist. Your caregiver may recommend the use of crutches to help reduce pain and discomfort, especially is the strain is severe enough to cause limping. If the tendon has pulled away from the bone, then surgery is necessary to reattach it. MEDICATION   If pain medicine is needed, nonsteroidal anti-inflammatory medicines (aspirin and ibuprofen), or other minor pain relievers (acetaminophen), are often advised.  Do not take pain medicine for 7 days before surgery.  Prescription pain relievers may  be given if your caregiver thinks they are needed. Use only as directed and only as much as you need.  Corticosteroid injections may be recommended. However, these injections should only be used for serious  cases, as they can only be given a certain number of times.  Ointments applied to the skin may be beneficial. HEAT AND COLD  Cold treatment (icing) relieves pain and reduces inflammation. Cold treatment should be applied for 10 to 15 minutes every 2 to 3 hours, and immediately after activity that aggravates your symptoms. Use ice packs or an ice massage.  Heat treatment may be used before performing the stretching and strengthening activities prescribed by your caregiver, physical therapist, or athletic trainer. Use a heat pack or a warm water soak. SEEK MEDICAL CARE IF:   Symptoms get worse or do not improve in 2 weeks, despite treatment.  New, unexplained symptoms develop. (Drugs used in treatment may produce side effects.) EXERCISES RANGE OF MOTION (ROM) AND STRETCHING EXERCISES - Hamstring Strain These exercises may help you when beginning to rehabilitate your injury. Your symptoms may go away with or without further involvement from your physician, physical therapist or athletic trainer. While completing these exercises, remember:   Restoring tissue flexibility helps normal motion to return to the joints. This allows healthier, less painful movement and activity.  An effective stretch should be held for at least 30 seconds.  A stretch should never be painful. You should only feel a gentle lengthening or release in the stretched tissue. STRETCH - Hamstrings, Standing  Stand or sit, and extend your right / left leg, placing your foot on a chair or foot stool.  Keep a slight arch in your low back and your hips straight forward.  Lead with your chest, and lean forward at the waist until you feel a gentle stretch in the back of your right / left knee or thigh. (When done correctly, this exercise requires leaning only a small distance.)  Hold this position for __________ seconds. Repeat __________ times. Complete this stretch __________ times per day. STRETCH - Hamstrings, Supine    Lie on your back. Loop a belt or towel over the ball of your right / left foot.  Straighten your right / left knee and slowly pull on the belt to raise your leg. Do not allow the right / left knee to bend. Keep your opposite leg flat on the floor.  Raise the leg until you feel a gentle stretch behind your right / left knee or thigh. Hold this position for __________ seconds. Repeat __________ times. Complete this stretch __________ times per day.  STRETCH - Hamstrings, Doorway  Lie on your back with your right / left leg extended and resting on the wall, and the opposite leg flat on the ground through the door. Initially, position your bottom farther away from the wall.  Keep your right / left knee straight. If you feel a stretch behind your knee or thigh, hold this position for __________ seconds.  If you do not feel a stretch, scoot your bottom closer to the door and hold __________ seconds. Repeat __________ times. Complete this stretch __________ times per day.  STRETCH - Hamstrings/Adductors, V-Sit   Sit on the floor with your legs extended in a large "V," keeping your knees straight.  With your head and chest upright, bend at your waist reaching for your left foot to stretch your right thigh muscles.  You should feel a stretch in your right inner thigh. Hold for  __________ seconds.  Return to the upright position to relax your leg muscles.  Continuing to keep your chest upright, bend straight forward at your waist to stretch your hamstrings.  You should feel a stretch behind both of your thighs and knees. Hold for __________ seconds.  Return to the upright position to relax your leg muscles.  With your head and chest upright, bend at your waist reaching for your right foot to stretch your left thigh muscles.  You should feel a stretch in your left inner thigh. Hold for __________ seconds.  Return to the upright position to relax your leg muscles. Repeat __________ times.  Complete this exercise __________ times per day.  STRENGTHENING EXERCISES - Hamstring Strain These exercises may help you when beginning to rehabilitate your injury. They may resolve your symptoms with or without further involvement from your physician, physical therapist or athletic trainer. While completing these exercises, remember:   Muscles can gain both the endurance and the strength needed for everyday activities through controlled exercises.  Complete these exercises as instructed by your physician, physical therapist or athletic trainer. Increase the resistance and repetitions only as guided.  You may experience muscle soreness or fatigue, but the pain or discomfort you are trying to eliminate should never get worse during these exercises. If this pain does get worse, stop and make certain you are following the directions exactly. If the pain is still present after adjustments, discontinue the exercise until you can discuss the trouble with your clinician. STRENGTH - Hip Extensors, Straight Leg Raises   Lie on your stomach on a firm surface.  Tense the muscles in your buttocks to lift your right / left leg about 4 inches. If you cannot lift your leg this high without arching your back, place a pillow under your hips.  Keep your knee straight. Hold for __________ seconds.  Slowly lower your leg to the starting position and allow it to relax completely before starting the next repetition. Repeat __________ times. Complete this exercise __________ times per day.  STRENGTH - Hamstring, Isometrics   Lie on your back on a firm surface.  Bend your right / left knee approximately __________ degrees.  Dig your heel into the surface, as if you are trying to pull it toward your buttocks. Tighten the muscles in the back of your thighs to "dig" as hard as you can, without increasing any pain.  Hold this position for __________ seconds.  Release the tension gradually and allow your muscles to  completely relax for __________ seconds between each exercise. Repeat __________ times. Complete this exercise __________ times per day.  STRENGTH - Hamstring, Curls   Lay on your stomach with your legs extended. (If you lay on a bed, your feet may hang over the edge.)  Tighten the muscles in the back of your thigh to bend your right / left knee up to 90 degrees. Keep your hips flat on the bed or floor.  Hold this position for __________ seconds.  Slowly lower your leg back to the starting position. Repeat __________ times. Complete this exercise __________ times per day.  OPTIONAL ANKLE WEIGHTS: Begin with ____________________, but DO NOT exceed ____________________. Increase in 1 pound/0.5 kilogram increments.   This information is not intended to replace advice given to you by your health care provider. Make sure you discuss any questions you have with your health care provider.   Document Released: 10/08/2005 Document Revised: 10/29/2014 Document Reviewed: 01/20/2009 Elsevier Interactive Patient Education [employer]

## 2016-05-22 NOTE — Progress Notes (Signed)
   Daisy Richardson is a 50 y.o. female who presents to [facility] today for right buttocks pain. Patient stumbled about 9 days ago and developed immediate pain in her right buttocks. Pain is worse with activity and sitting on her buttocks. She denies any falls to the ground or other injury. No pain radiating beyond the posterior thigh. No weakness or numbness bowel bladder dysfunction. No fevers or chills. She has used ice rest and some home exercises that have not helped much.   No past medical history on file. No past surgical history on file. Social History  Substance Use Topics  . Smoking status: Never Smoker  . Smokeless tobacco: Not on file  . Alcohol use Yes   family history includes Diabetes in her father.  ROS:  No headache, visual changes, nausea, vomiting, diarrhea, constipation, dizziness, abdominal pain, skin rash, fevers, chills, night sweats, weight loss, swollen lymph nodes, body aches, joint swelling, muscle aches, chest pain, shortness of breath, mood changes, visual or auditory hallucinations.    Medications: No current outpatient prescriptions on file.   No current facility-administered medications for this visit.    No Known Allergies   Exam:  BP (!) 141/93   Pulse 64   Wt 158 lb (71.7 kg)   BMI 25.50 kg/m  General: Well Developed, well nourished, and in no acute distress.  Neuro/Psych: Alert and oriented x3, extra-ocular muscles intact, able to move all 4 extremities, sensation grossly intact. Skin: Warm and dry, no rashes noted.  Respiratory: Not using accessory muscles, speaking in full sentences, trachea midline.  Cardiovascular: Pulses palpable, no extremity edema. Abdomen: Does not appear distended. MSK: Pelvis is normal appearing bilaterally. Right initial tuberosity is tender to palpation. Hip motion is normal. Pain with resisted knee flexion. Normal gait. Pulses capillary refill and sensation intact  distally.   No results found for this or any previous visit (from the past 24 hour(s)). No results found.   Assessment and Plan: 50 y.o. female with  insertional hamstring strain of the initial tuberosity. Plan for physical therapy home exercise program and Tylenol. Return in one month for recheck.   Discussed warning signs or symptoms. Please see discharge instructions. Patient expresses understanding.

## 2016-06-26 ENCOUNTER — Ambulatory Visit: Payer: No Typology Code available for payment source | Admitting: Family Medicine

## 2018-05-04 ENCOUNTER — Other Ambulatory Visit: Payer: Self-pay

## 2018-05-04 ENCOUNTER — Emergency Department (INDEPENDENT_AMBULATORY_CARE_PROVIDER_SITE_OTHER)
Admission: EM | Admit: 2018-05-04 | Discharge: 2018-05-04 | Disposition: A | Discharge status: Home / Self Care | Payer: 59 | Source: Home / Self Care

## 2018-05-04 ENCOUNTER — Encounter: Payer: Self-pay | Admitting: Emergency Medicine

## 2018-05-04 DIAGNOSIS — S76309A Unspecified injury of muscle, fascia and tendon of the posterior muscle group at thigh level, unspecified thigh, initial encounter: Secondary | ICD-10-CM

## 2018-05-04 MED ORDER — IBUPROFEN 600 MG PO TABS: 600.0000 mg | ORAL_TABLET | Freq: Four times a day (QID) | ORAL | 0 refills | Status: AC | PRN

## 2018-05-04 MED ORDER — METHOCARBAMOL 500 MG PO TABS: 500.0000 mg | ORAL_TABLET | Freq: Two times a day (BID) | ORAL | 0 refills | Status: AC

## 2018-05-04 NOTE — Discharge Instructions (Signed)
See your Orthopaedist next week for recheck

## 2018-05-04 NOTE — ED Triage Notes (Signed)
Patient tripped and fell 4 days ago and has had pain in upper/inner left thigh since then; treated it with heat/ice and aleve without total relief.

## 2018-05-05 NOTE — ED Provider Notes (Signed)
[facility]    CSN: [id number] Arrival date & time: 05/04/18  1134     History   Chief Complaint Chief Complaint  Patient presents with  . Leg Pain    HPI Daisy Richardson is a 52 y.o. female.   The history is provided by the patient. No language interpreter was used.  Leg Pain  Location:  Leg Time since incident:  4 days Leg location:  R leg and L upper leg Pain details:    Quality:  Aching   Radiates to:  Does not radiate   Severity:  Moderate   Onset quality:  Gradual   Timing:  Constant   Progression:  Worsening Chronicity:  New Dislocation: no   Foreign body present:  No foreign bodies Prior injury to area:  No Relieved by:  Nothing Worsened by:  Nothing Ineffective treatments:  None tried Risk factors: no concern for non-accidental trauma    Pt reports she fell and had a pulling sensation in the back of her leg.  Pt reports area is still very painful.  Pt reports pain with walking and with stretching History reviewed. No pertinent past medical history.  Patient Active Problem List   Diagnosis Date Noted  . Hamstring strain 05/22/2016  . Greater trochanteric bursitis of right hip 08/31/2015  . 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 Pes planus of both feet 08/31/2015  . Bunion of great toe 08/31/2015    History reviewed. No pertinent surgical history.  OB History   None      Home Medications    Prior to Admission medications   Medication Sig Start Date End Date Taking? Authorizing Provider  ibuprofen (ADVIL,MOTRIN) 600 MG tablet Take 1 tablet (600 mg total) by mouth every 6 (six) hours as needed. 05/04/18   Elson Areas, PA-C  methocarbamol (ROBAXIN) 500 MG tablet Take 1 tablet (500 mg total) by mouth 2 (two) times daily. 05/04/18   Elson Areas, PA-C    Family History Family History  Problem Relation Age of Onset  . Diabetes Father     Social History Social History   Tobacco Use  . Smoking status: Never  Smoker  Substance Use Topics  . Alcohol use: Yes  . Drug use: No     Allergies   Patient has no known allergies.   Review of Systems Review of Systems  Musculoskeletal: Positive for myalgias.  All other systems reviewed and are negative.    Physical Exam Triage Vital Signs ED Triage Vitals  Enc Vitals Group     BP 05/04/18 1157 133/80     Pulse Rate 05/04/18 1157 66     Resp 05/04/18 1157 18     Temp 05/04/18 1157 98.4 F (36.9 C)     Temp Source 05/04/18 1157 Oral     SpO2 05/04/18 1157 100 %     Weight 05/04/18 1159 150 lb (68 kg)     Height 05/04/18 1159 5\' 6"  (1.676 m)     Head Circumference --      Peak Flow --      Pain Score 05/04/18 1158 6     Pain Loc --      Pain Edu? --      Excl. in GC? --    No data found.  Updated Vital Signs BP 133/80 (BP Location: Right Arm)   Pulse 66   Temp 98.4 F (36.9 C) (Oral)   Resp 18   Ht 5\' 6"  (1.676  m)   Wt 150 lb (68 kg)   LMP 04/15/2018 (Exact Date)   SpO2 100%   BMI 24.21 kg/m   Visual Acuity Right Eye Distance:   Left Eye Distance:   Bilateral Distance:    Right Eye Near:   Left Eye Near:    Bilateral Near:     Physical Exam  Constitutional: She appears well-developed and well-nourished.  Musculoskeletal: She exhibits tenderness.  Tender posterior thigh,  Pain to joint of knee,  From ns and nv intact  Neurological: She is alert.  Skin: Skin is warm.  Psychiatric: She has a normal mood and affect.  Nursing note and vitals reviewed.    UC Treatments / Results  Labs (all labs ordered are listed, but only abnormal results are displayed) Labs Reviewed - No data to display  EKG None  Radiology No results found.  Procedures Procedures (including critical care time)  Medications Ordered in UC Medications - No data to display  Initial Impression / Assessment and Plan / UC Course  I have reviewed the triage vital signs and the nursing notes.  Pertinent labs & imaging results that were  available during my care of the patient were reviewed by me and considered in my medical decision making (see chart for details).     MDM  Ot counseled on Hamstring tear.  Pt advised ice, compression, rest.  Final Clinical Impressions(s) / UC Diagnoses   Final diagnoses:  Hamstring injury, initial encounter     Discharge Instructions     See your Orthopaedist next week for recheck    ED Prescriptions    Medication Sig Dispense Auth. Provider   ibuprofen (ADVIL,MOTRIN) 600 MG tablet Take 1 tablet (600 mg total) by mouth every 6 (six) hours as needed. 30 tablet Sofia, Leslie K, [place]   methocarbamol (ROBAXIN) 500 MG tablet Take 1 tablet (500 mg total) by mouth 2 (two) times daily. 20 tablet Elson AreasSofia, Leslie K, [place]C     Controlled Substance Prescriptions  Controlled Substance Registry consulted? Not Applicable  An After Visit Summary was printed and given to the patient.    Elson AreasSofia, Leslie K, [place] 05/05/18 1014

## 2019-04-04 ENCOUNTER — Encounter: Payer: Self-pay | Admitting: Emergency Medicine

## 2019-04-04 ENCOUNTER — Other Ambulatory Visit: Payer: Self-pay

## 2019-04-04 ENCOUNTER — Emergency Department (INDEPENDENT_AMBULATORY_CARE_PROVIDER_SITE_OTHER): Payer: 59

## 2019-04-04 ENCOUNTER — Emergency Department (INDEPENDENT_AMBULATORY_CARE_PROVIDER_SITE_OTHER)
Admission: EM | Admit: 2019-04-04 | Discharge: 2019-04-04 | Disposition: A | Discharge status: Home / Self Care | Payer: 59 | Source: Home / Self Care

## 2019-04-04 DIAGNOSIS — M7989 Other specified soft tissue disorders: Secondary | ICD-10-CM

## 2019-04-04 DIAGNOSIS — M79671 Pain in right foot: Secondary | ICD-10-CM

## 2019-04-04 DIAGNOSIS — S90851A Superficial foreign body, right foot, initial encounter: Secondary | ICD-10-CM | POA: Diagnosis not present

## 2019-04-04 DIAGNOSIS — Z23 Encounter for immunization: Secondary | ICD-10-CM

## 2019-04-04 MED ORDER — TETANUS-DIPHTH-ACELL PERTUSSIS 5-2.5-18.5 LF-MCG/0.5 IM SUSP
0.5000 mL | Freq: Once | INTRAMUSCULAR | Status: AC
  Administered 2019-04-04: 0.5 mL via INTRAMUSCULAR

## 2019-04-04 MED ORDER — CEPHALEXIN 500 MG PO CAPS: 500.0000 mg | ORAL_CAPSULE | Freq: Three times a day (TID) | ORAL | 0 refills | Status: AC

## 2019-04-04 NOTE — ED Triage Notes (Signed)
Patient may have gotten wood splinter from park into right heel 2 days ago; tender and she has done some attempts to remove unseen object. She has not travelled past 4 weeks. She has no idea when last Tdap was given.

## 2019-04-04 NOTE — Discharge Instructions (Signed)
°  You may soak your foot in warm water and Epson salt 2-3 times daily for 20 minutes at a time.  You may apply over the counter antibiotic and a bandage.  Try to limit wearing tight shoes while wound is healing. Be sure to wear a bandage if you must wear shoes with a closed back.   Please follow up with family medicine or sports medicine in 1 week if not improving, sooner if worsening.   Please take antibiotics as prescribed and be sure to complete entire course even if you start to feel better to ensure infection does not come back.

## 2019-04-04 NOTE — ED Provider Notes (Signed)
[facility]    CSN: [id number] Arrival date & time: 04/04/19  1438     History   ChiIvar Drapeef Complaint Chief Complaint  Patient presents with  . Foot Injury    HPI [name] is a 53 y.o. female.   HPI  [name] is a 53 y.o. female presenting to UC with c/o Right posterior heel pain that started 2 days ago after walking [place] a trail at a park. She felt like a wood chip or rose needle stick the back of her heal. She is concerned the pain has persisted that there might still be a splinter in her skin.  She tried soaking her foot once w/o relief.  Denies drainage from the area.  Unsure of last tetanus.   History reviewed. No pertinent past medical history.  Patient Active Problem List   Diagnosis Date Noted  . Hamstring strain 05/22/2016  . Greater trochanteric bursitis of right hip 08/31/2015  . 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 Pes planus of both feet 08/31/2015  . Bunion of great toe 08/31/2015    History reviewed. No pertinent surgical history.  OB History   No obstetric history on file.      Home Medications    Prior to Admission medications   Medication Sig Start Date End Date Taking? Authorizing Provider  cephALEXin (KEFLEX) 500 MG capsule Take 1 capsule (500 mg total) by mouth 3 (three) times daily. 04/04/19   Lurene ShadowPhelps, Astou Lada O, PA-C  ibuprofen (ADVIL,MOTRIN) 600 MG tablet Take 1 tablet (600 mg total) by mouth every 6 (six) hours as needed. 05/04/18   Elson AreasSofia, Leslie K, PA-C  methocarbamol (ROBAXIN) 500 MG tablet Take 1 tablet (500 mg total) by mouth 2 (two) times daily. 05/04/18   Elson AreasSofia, Leslie K, PA-C    Family History Family History  Problem Relation Age of Onset  . Diabetes Father     Social History Social History   Tobacco Use  . Smoking status: Never Smoker  Substance Use Topics  . Alcohol use: Yes  . Drug use: No     Allergies   Patient has no known allergies.   Review of Systems Review of Systems   Musculoskeletal: Positive for arthralgias. Negative for myalgias.  Skin: Positive for color change. Negative for wound.     Physical Exam Triage Vital Signs ED Triage Vitals [04/04/19 1627]  Enc Vitals Group     BP      Pulse      Resp      Temp      Temp src      SpO2      Weight 145 lb (65.8 kg)     Height 5\' 6"  (1.676 m)     Head Circumference      Peak Flow      Pain Score 5     Pain Loc      Pain Edu?      Excl. in GC?    No data found.  Updated Vital Signs BP (!) 147/84 (BP Location: Right Arm)   Pulse (!) 56   Temp 97.6 F (36.4 C) (Oral)   Resp 16   Ht 5\' 6"  (1.676 m)   Wt 145 lb (65.8 kg)   SpO2 100%   BMI 23.40 kg/m   Visual Acuity Right Eye Distance:   Left Eye Distance:   Bilateral Distance:    Right Eye Near:   Left Eye Near:    Bilateral Near:  Physical Exam Vitals signs and nursing note reviewed.  Constitutional:      Appearance: Normal appearance. She is well-developed.  HENT:     Head: Normocephalic and atraumatic.  Neck:     Musculoskeletal: Normal range of motion.  Cardiovascular:     Rate and Rhythm: Normal rate.     Pulses:          Dorsalis pedis pulses are 2+ on the right side.       Posterior tibial pulses are 2+ on the right side.  Pulmonary:     Effort: Pulmonary effort is normal.  Musculoskeletal: Normal range of motion.        General: Tenderness present.     Comments: Right heel: no obvious edema. Tenderness to posterior aspect. Full ROM ankle and toes.  Skin:    General: Skin is warm and dry.     Capillary Refill: Capillary refill takes less than 2 seconds.     Comments: Right posterior heel: callused skin. Darkened/brown appearing object/discharge visualized under callus. Tender. No bleeding or discharge.  Neurological:     Mental Status: She is alert and oriented to person, place, and time.  Psychiatric:        Behavior: Behavior normal.      UC Treatments / Results  Labs (all labs ordered are listed,  but only abnormal results are displayed) Labs Reviewed - No data to display  EKG None  Radiology Dg Os Calcis Right  Result Date: 04/04/2019 CLINICAL DATA:  53 year-old female was walking at a park and had a hole in her sock and thinks she got a splinter in her RIGHT heel x 2 days ago. EXAM: RIGHT OS CALCIS - 2+ VIEW COMPARISON:  None. FINDINGS: No fracture or bone lesion. Subtalar joint is normally spaced and aligned. No arthropathic changes. Soft tissues are unremarkable. No radiopaque foreign body. No soft tissue air. IMPRESSION: Negative. Electronically Signed   By: Lajean Manes M.D.   On: 04/04/2019 16:40    Procedures Foreign Body Removal  Date/Time: 04/04/2019 5:01 PM Performed by: Noe Gens, PA-C Authorized by: Noe Gens, PA-C   Consent:    Consent obtained:  Verbal   Consent given by:  Patient   Risks discussed:  Bleeding, infection, incomplete removal, pain and worsening of condition   Alternatives discussed:  No treatment and alternative treatment Location:    Location:  Foot   Foot location:  R heel   Depth:  Subcutaneous   Tendon involvement:  None Pre-procedure details:    Imaging:  X-ray   Neurovascular status: intact     Preparation: Patient was prepped and draped in usual sterile fashion   Anesthesia (see MAR for exact dosages):    Anesthesia method:  Local infiltration   Local anesthetic:  Lidocaine 1% WITH epi Procedure type:    Procedure complexity:  Simple Procedure details:    Scalpel size:  11   Incision length:  1cm   Localization method:  Visualized   Dissection of underlying tissues: yes (calus )     Removal mechanism:  Forceps and hemostat   Foreign bodies recovered: scant brownish drainage under callus. questrionable old wood/thorn.   Intact foreign body removal: no   Post-procedure details:    Confirmation:  No additional foreign bodies on visualization   Skin closure:  None   Dressing:  Antibiotic ointment and non-adherent  dressing   Patient tolerance of procedure:  Tolerated well, no immediate complications   (including critical care  time)  Medications Ordered in UC Medications  Tdap (BOOSTRIX) injection 0.5 mL (0.5 mLs Intramuscular Given 04/04/19 1642)    Initial Impression / Assessment and Plan / UC Course  I have reviewed the triage vital signs and the nursing notes.  Pertinent labs & imaging results that were available during my care of the patient were reviewed by me and considered in my medical decision making (see chart for details).     Believe small piece of wood was disintegrating under the callus. Callus and suspected FB removed. Bacitracin and bandage applied. Due to reported soreness in the area, will start pt on Keflex to cover for potential early skin infection.  Final Clinical Impressions(s) / UC Diagnoses   Final diagnoses:  Pain of right heel  Acute foreign body of right heel, initial encounter     Discharge Instructions      You may soak your foot in warm water and Epson salt 2-3 times daily for 20 minutes at a time.  You may apply over the counter antibiotic and a bandage.  Try to limit wearing tight shoes while wound is healing. Be sure to wear a bandage if you must wear shoes with a closed back.   Please follow up with family medicine or sports medicine in 1 week if not improving, sooner if worsening.   Please take antibiotics as prescribed and be sure to complete entire course even if you start to feel better to ensure infection does not come back.     ED Prescriptions    Medication Sig Dispense Auth. Provider   cephALEXin (KEFLEX) 500 MG capsule Take 1 capsule (500 mg total) by mouth 3 (three) times daily. 21 capsule Lurene ShadowPhelps, Katelin Kutsch O, PA-C     Controlled Substance Prescriptions [place] Controlled Substance Registry consulted? Not Applicable   Rolla Platehelps, Dilyn Osoria O, PA-C 04/05/19 1230

## 2019-04-07 ENCOUNTER — Telehealth: Payer: Self-pay

## 2019-04-07 NOTE — Telephone Encounter (Signed)
Left message on VM with contact information for any questions or concerns. 

## 2020-02-17 IMAGING — DX RIGHT OS CALCIS - 2+ VIEW
2 series · 2 of 2 positions shown · non-contrast
Comparison: None.

CLINICAL DATA: 52 year-old female was walking at a park and had a
hole in her sock and thinks she got a splinter in her RIGHT heel x 2
days ago.

EXAM:
RIGHT OS CALCIS - 2+ VIEW

[calcaneus axial]
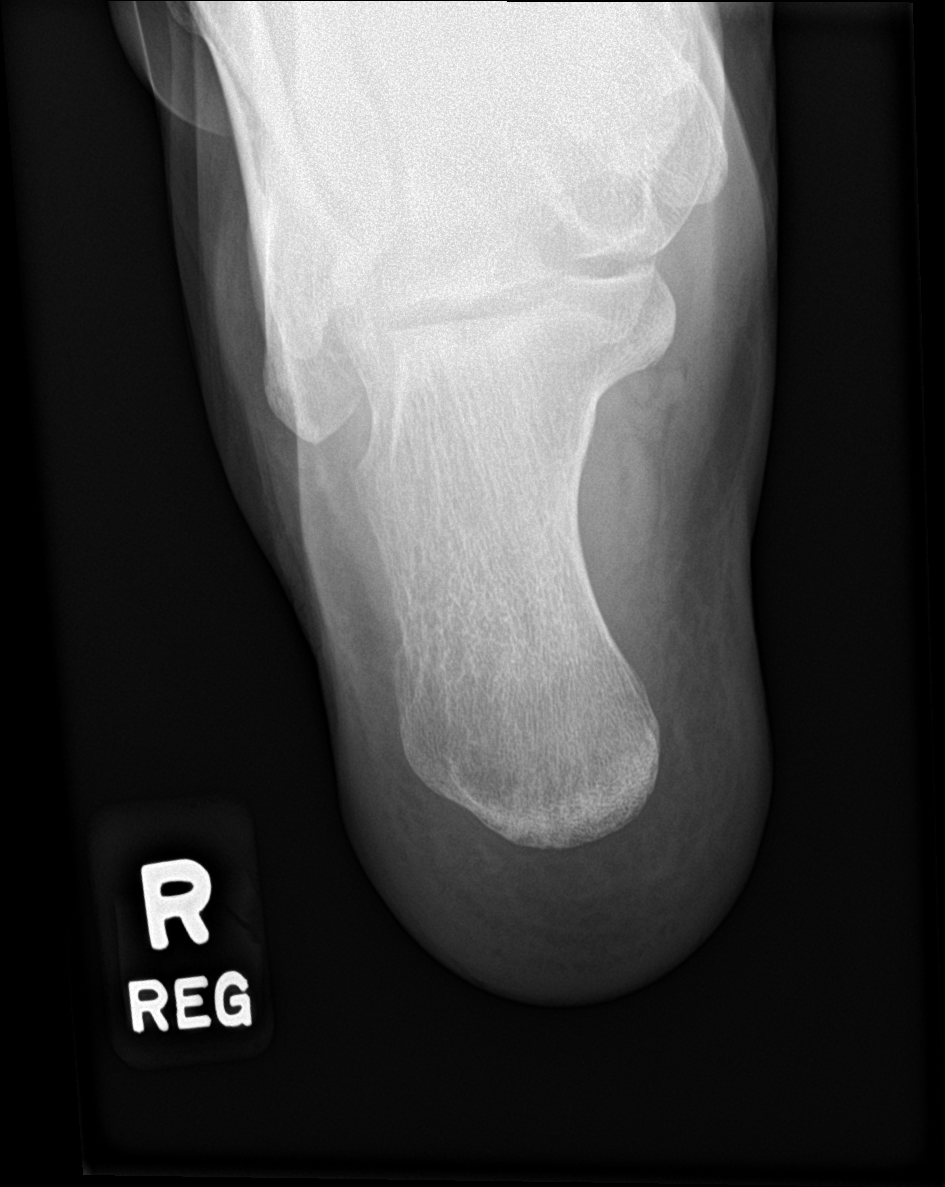

[calcaneus lat]
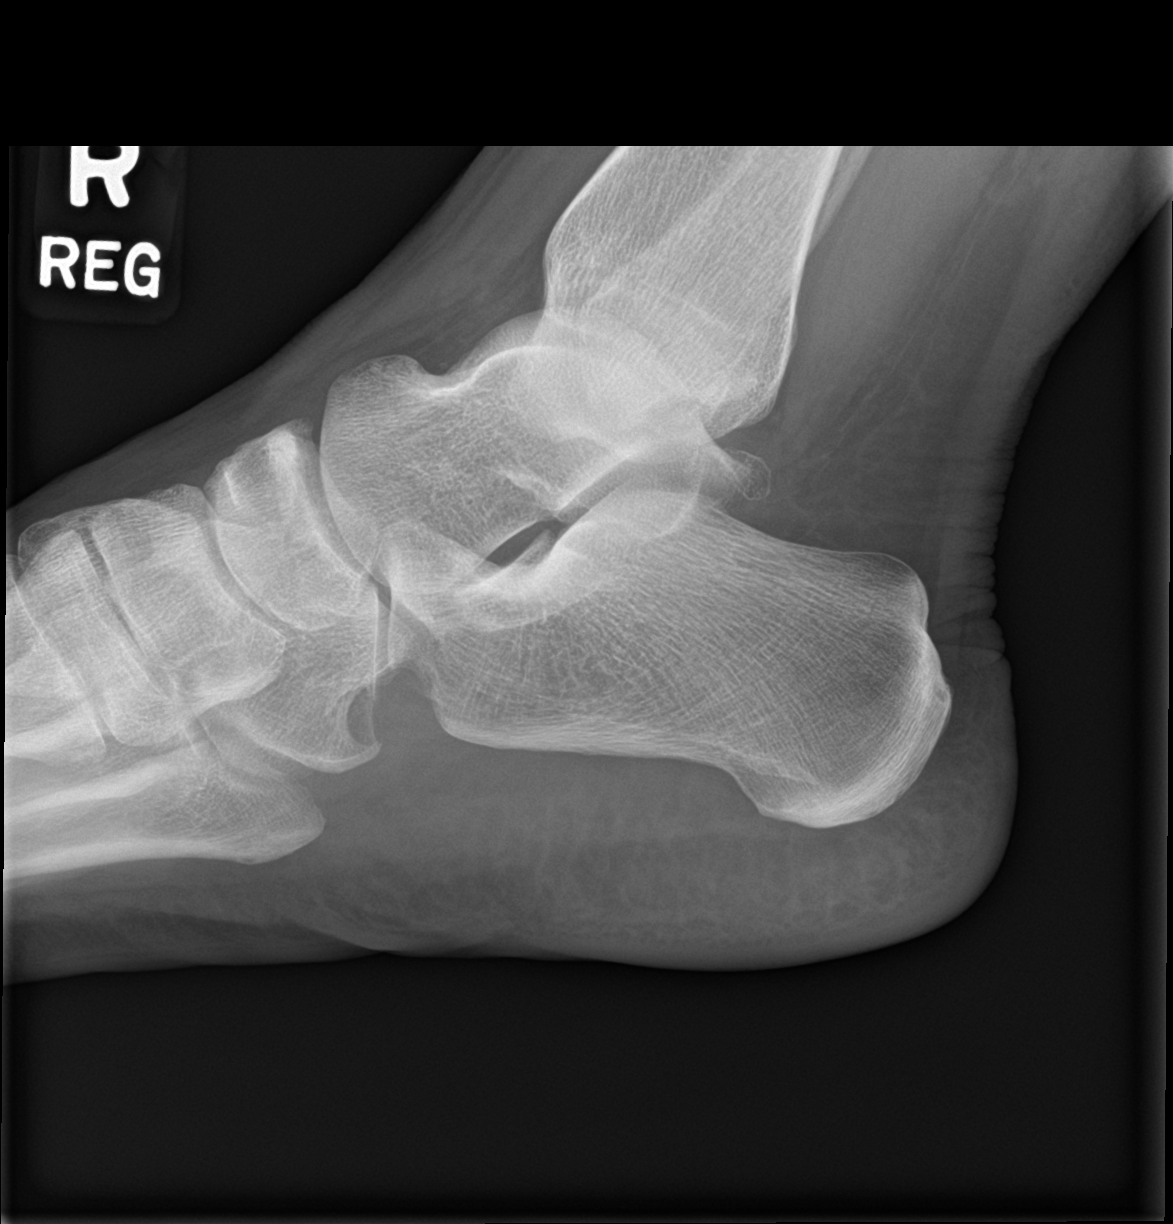

[2 of 2 positions shown; findings below may reference images not displayed]

FINDINGS: No fracture or bone lesion.

Subtalar joint is normally spaced and aligned. No arthropathic
changes.

Soft tissues are unremarkable. No radiopaque foreign body. No soft
tissue air.
IMPRESSION: Negative.
